# Patient Record
Sex: Female | Born: 1980 | Race: White | Hispanic: No | Marital: Single | Smoking: Current every day smoker
Health system: Southern US, Community
[De-identification: ages and names within clinical notes are randomized; demographics above are authoritative.]

## PROBLEM LIST (undated history)

## (undated) DIAGNOSIS — F909 Attention-deficit hyperactivity disorder, unspecified type: Secondary | ICD-10-CM

## (undated) HISTORY — PX: TONSILLECTOMY: SUR1361

---
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
## 1999-03-08 ENCOUNTER — Other Ambulatory Visit: Admission: RE | Admit: 1999-03-08 | Discharge: 1999-03-08 | Payer: Self-pay | Admitting: Obstetrics and Gynecology

## 1999-05-01 ENCOUNTER — Encounter (HOSPITAL_COMMUNITY): Admission: RE | Admit: 1999-05-01 | Discharge: 1999-05-08 | Payer: Self-pay | Admitting: Obstetrics and Gynecology

## 1999-05-05 ENCOUNTER — Encounter (INDEPENDENT_AMBULATORY_CARE_PROVIDER_SITE_OTHER): Payer: Self-pay | Admitting: Specialist

## 1999-05-05 ENCOUNTER — Inpatient Hospital Stay (HOSPITAL_COMMUNITY): Admission: AD | Admit: 1999-05-05 | Discharge: 1999-05-07 | Payer: Self-pay | Admitting: Obstetrics and Gynecology

## 1999-06-19 ENCOUNTER — Other Ambulatory Visit: Admission: RE | Admit: 1999-06-19 | Discharge: 1999-06-19 | Payer: Self-pay | Admitting: Obstetrics and Gynecology

## 1999-06-30 ENCOUNTER — Encounter (INDEPENDENT_AMBULATORY_CARE_PROVIDER_SITE_OTHER): Payer: Self-pay | Admitting: Specialist

## 1999-06-30 ENCOUNTER — Other Ambulatory Visit: Admission: RE | Admit: 1999-06-30 | Discharge: 1999-06-30 | Payer: Self-pay | Admitting: Obstetrics and Gynecology

## 2004-08-14 ENCOUNTER — Emergency Department (HOSPITAL_COMMUNITY): Admission: EM | Admit: 2004-08-14 | Discharge: 2004-08-14 | Payer: Self-pay | Admitting: *Deleted

## 2005-01-30 ENCOUNTER — Emergency Department (HOSPITAL_COMMUNITY): Admission: EM | Admit: 2005-01-30 | Discharge: 2005-01-30 | Payer: Self-pay | Admitting: *Deleted

## 2009-08-04 ENCOUNTER — Emergency Department (HOSPITAL_COMMUNITY): Admission: EM | Admit: 2009-08-04 | Discharge: 2009-08-04 | Payer: Self-pay | Admitting: Emergency Medicine

## 2011-02-09 LAB — D-DIMER, QUANTITATIVE: D-Dimer, Quant: <0.22 ug/mL-FEU (ref 0.00–0.48)

## 2011-02-19 ENCOUNTER — Emergency Department (HOSPITAL_COMMUNITY)
Admission: EM | Admit: 2011-02-19 | Discharge: 2011-02-19 | Disposition: A | Discharge status: Home / Self Care | Payer: BLUE CROSS/BLUE SHIELD | Attending: Emergency Medicine | Admitting: Emergency Medicine

## 2011-02-19 DIAGNOSIS — L989 Disorder of the skin and subcutaneous tissue, unspecified: Secondary | ICD-10-CM | POA: Insufficient documentation

## 2011-02-19 DIAGNOSIS — L03039 Cellulitis of unspecified toe: Secondary | ICD-10-CM | POA: Insufficient documentation

## 2011-02-19 DIAGNOSIS — L02619 Cutaneous abscess of unspecified foot: Secondary | ICD-10-CM | POA: Insufficient documentation

## 2011-02-21 ENCOUNTER — Emergency Department (HOSPITAL_COMMUNITY)
Admission: EM | Admit: 2011-02-21 | Discharge: 2011-02-21 | Disposition: A | Discharge status: Home / Self Care | Payer: BLUE CROSS/BLUE SHIELD | Attending: Emergency Medicine | Admitting: Emergency Medicine

## 2011-02-21 DIAGNOSIS — Z09 Encounter for follow-up examination after completed treatment for conditions other than malignant neoplasm: Secondary | ICD-10-CM | POA: Insufficient documentation

## 2011-02-21 DIAGNOSIS — L02619 Cutaneous abscess of unspecified foot: Secondary | ICD-10-CM | POA: Insufficient documentation

## 2011-02-21 LAB — WOUND CULTURE: Gram Stain: NONE SEEN

## 2011-03-27 ENCOUNTER — Emergency Department (HOSPITAL_COMMUNITY): Payer: BLUE CROSS/BLUE SHIELD

## 2011-03-27 ENCOUNTER — Emergency Department (HOSPITAL_COMMUNITY)
Admission: EM | Admit: 2011-03-27 | Discharge: 2011-03-27 | Disposition: A | Discharge status: Home / Self Care | Payer: BLUE CROSS/BLUE SHIELD | Attending: Emergency Medicine | Admitting: Emergency Medicine

## 2011-03-27 DIAGNOSIS — R197 Diarrhea, unspecified: Secondary | ICD-10-CM | POA: Insufficient documentation

## 2011-03-27 DIAGNOSIS — R509 Fever, unspecified: Secondary | ICD-10-CM | POA: Insufficient documentation

## 2011-03-27 DIAGNOSIS — B9789 Other viral agents as the cause of diseases classified elsewhere: Secondary | ICD-10-CM | POA: Insufficient documentation

## 2011-03-27 DIAGNOSIS — R112 Nausea with vomiting, unspecified: Secondary | ICD-10-CM | POA: Insufficient documentation

## 2011-03-27 DIAGNOSIS — R05 Cough: Secondary | ICD-10-CM | POA: Insufficient documentation

## 2011-03-27 DIAGNOSIS — E860 Dehydration: Secondary | ICD-10-CM | POA: Insufficient documentation

## 2011-03-27 DIAGNOSIS — R059 Cough, unspecified: Secondary | ICD-10-CM | POA: Insufficient documentation

## 2011-03-27 LAB — BASIC METABOLIC PANEL
BUN: 8 mg/dL (ref 6–23)
CO2: 24 mEq/L (ref 19–32)
Chloride: 98 mEq/L (ref 96–112)
GFR calc non Af Amer: >60 mL/min (ref >60)
Glucose, Bld: 99 mg/dL (ref 70–99)
Potassium: 3.8 mEq/L (ref 3.5–5.1)
Sodium: 134 mEq/L — ABNORMAL LOW (ref 135–145)

## 2011-03-27 LAB — DIFFERENTIAL
Basophils Absolute: 0 10*3/uL (ref 0.0–0.1)
Basophils Relative: 0 % (ref 0–1)
Lymphocytes Relative: 21 % (ref 12–46)
Monocytes Absolute: 0.7 10*3/uL (ref 0.1–1.0)
Neutro Abs: 2.6 10*3/uL (ref 1.7–7.7)
Neutrophils Relative %: 62 % (ref 43–77)

## 2011-03-27 LAB — POCT I-STAT, CHEM 8
BUN: 8 mg/dL (ref 6–23)
Chloride: 102 mEq/L (ref 96–112)
Creatinine, Ser: 1 mg/dL (ref 0.4–1.2)
Glucose, Bld: 100 mg/dL — ABNORMAL HIGH (ref 70–99)
Potassium: 3.9 mEq/L (ref 3.5–5.1)
Sodium: 136 mEq/L (ref 135–145)

## 2011-03-27 LAB — URINALYSIS, ROUTINE W REFLEX MICROSCOPIC
Glucose, UA: NEGATIVE mg/dL
Specific Gravity, Urine: 1.029 (ref 1.005–1.030)
Urobilinogen, UA: 1 mg/dL (ref 0.0–1.0)
pH: 6 (ref 5.0–8.0)

## 2011-03-27 LAB — CBC
HCT: 44.5 % (ref 36.0–46.0)
Hemoglobin: 15.2 g/dL — ABNORMAL HIGH (ref 12.0–15.0)
RBC: 4.79 MIL/uL (ref 3.87–5.11)

## 2011-03-27 LAB — URINE MICROSCOPIC-ADD ON

## 2011-03-28 LAB — URINE CULTURE: Culture: NO GROWTH

## 2012-06-20 ENCOUNTER — Emergency Department (HOSPITAL_COMMUNITY): Payer: BC Managed Care – PPO

## 2012-06-20 ENCOUNTER — Encounter (HOSPITAL_COMMUNITY): Payer: Self-pay | Admitting: Family Medicine

## 2012-06-20 ENCOUNTER — Emergency Department (HOSPITAL_COMMUNITY)
Admission: EM | Admit: 2012-06-20 | Discharge: 2012-06-20 | Disposition: A | Discharge status: Home / Self Care | Payer: BC Managed Care – PPO | Attending: Emergency Medicine | Admitting: Emergency Medicine

## 2012-06-20 DIAGNOSIS — E119 Type 2 diabetes mellitus without complications: Secondary | ICD-10-CM | POA: Insufficient documentation

## 2012-06-20 DIAGNOSIS — R079 Chest pain, unspecified: Secondary | ICD-10-CM | POA: Insufficient documentation

## 2012-06-20 DIAGNOSIS — M545 Low back pain, unspecified: Secondary | ICD-10-CM | POA: Insufficient documentation

## 2012-06-20 DIAGNOSIS — M542 Cervicalgia: Secondary | ICD-10-CM | POA: Insufficient documentation

## 2012-06-20 DIAGNOSIS — F17200 Nicotine dependence, unspecified, uncomplicated: Secondary | ICD-10-CM | POA: Insufficient documentation

## 2012-06-20 DIAGNOSIS — Y92410 Unspecified street and highway as the place of occurrence of the external cause: Secondary | ICD-10-CM | POA: Insufficient documentation

## 2012-06-20 MED ORDER — HYDROCODONE-ACETAMINOPHEN 5-325 MG PO TABS: 2.0000 | ORAL_TABLET | Freq: Once | ORAL | Status: DC

## 2012-06-20 MED ORDER — OXYCODONE-ACETAMINOPHEN 5-325 MG PO TABS
2.0000 | ORAL_TABLET | Freq: Once | ORAL | Status: AC
  Administered 2012-06-20: 2 via ORAL
  Filled 2012-06-20: qty 2

## 2012-06-20 MED ORDER — HYDROCODONE-ACETAMINOPHEN 5-325 MG PO TABS: 2.0000 | ORAL_TABLET | ORAL | Status: AC | PRN
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
## 2012-06-20 NOTE — ED Notes (Signed)
Pt not in room.
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
## 2013-09-09 ENCOUNTER — Encounter (HOSPITAL_COMMUNITY): Payer: Self-pay | Admitting: Emergency Medicine

## 2013-09-09 ENCOUNTER — Emergency Department (HOSPITAL_COMMUNITY): Payer: BC Managed Care – PPO

## 2013-09-09 ENCOUNTER — Emergency Department (HOSPITAL_COMMUNITY)
Admission: EM | Admit: 2013-09-09 | Discharge: 2013-09-09 | Disposition: A | Discharge status: Home / Self Care | Payer: BC Managed Care – PPO | Attending: Emergency Medicine | Admitting: Emergency Medicine

## 2013-09-09 DIAGNOSIS — R112 Nausea with vomiting, unspecified: Secondary | ICD-10-CM | POA: Insufficient documentation

## 2013-09-09 DIAGNOSIS — Z3202 Encounter for pregnancy test, result negative: Secondary | ICD-10-CM | POA: Insufficient documentation

## 2013-09-09 DIAGNOSIS — E119 Type 2 diabetes mellitus without complications: Secondary | ICD-10-CM | POA: Insufficient documentation

## 2013-09-09 DIAGNOSIS — F909 Attention-deficit hyperactivity disorder, unspecified type: Secondary | ICD-10-CM | POA: Insufficient documentation

## 2013-09-09 DIAGNOSIS — N12 Tubulo-interstitial nephritis, not specified as acute or chronic: Secondary | ICD-10-CM | POA: Insufficient documentation

## 2013-09-09 DIAGNOSIS — Z79899 Other long term (current) drug therapy: Secondary | ICD-10-CM | POA: Insufficient documentation

## 2013-09-09 DIAGNOSIS — F17200 Nicotine dependence, unspecified, uncomplicated: Secondary | ICD-10-CM | POA: Insufficient documentation

## 2013-09-09 HISTORY — DX: Attention-deficit hyperactivity disorder, unspecified type: F90.9

## 2013-09-09 LAB — URINALYSIS, ROUTINE W REFLEX MICROSCOPIC
Nitrite: POSITIVE — AB
Specific Gravity, Urine: 1.01 (ref 1.005–1.030)
Urobilinogen, UA: 0.2 mg/dL (ref 0.0–1.0)
pH: 7 (ref 5.0–8.0)

## 2013-09-09 LAB — POCT PREGNANCY, URINE: Preg Test, Ur: NEGATIVE

## 2013-09-09 LAB — URINE MICROSCOPIC-ADD ON

## 2013-09-09 MED ORDER — OXYCODONE-ACETAMINOPHEN 5-325 MG PO TABS
1.0000 | ORAL_TABLET | Freq: Once | ORAL | Status: AC
  Administered 2013-09-09: 1 via ORAL
  Filled 2013-09-09: qty 1

## 2013-09-09 MED ORDER — OXYCODONE-ACETAMINOPHEN 5-325 MG PO TABS: 1.0000 | ORAL_TABLET | Freq: Four times a day (QID) | ORAL | Status: DC | PRN

## 2013-09-09 MED ORDER — ONDANSETRON 4 MG PO TBDP: 4.0000 mg | ORAL_TABLET | Freq: Three times a day (TID) | ORAL | Status: DC | PRN

## 2013-09-09 MED ORDER — CIPROFLOXACIN HCL 500 MG PO TABS: 500.0000 mg | ORAL_TABLET | Freq: Two times a day (BID) | ORAL | Status: DC

## 2013-09-09 MED ORDER — MORPHINE SULFATE 4 MG/ML IJ SOLN
4.0000 mg | Freq: Once | INTRAMUSCULAR | Status: AC
  Administered 2013-09-09: 4 mg via INTRAVENOUS
  Filled 2013-09-09: qty 1

## 2013-09-09 NOTE — Progress Notes (Signed)
Patient confirms she does not have a pcp.  [facility] provided patient with a list of pcps who accept [employer] and [employer].  Patient thankful for information.  No further needs at this time.

## 2013-09-09 NOTE — ED Notes (Signed)
Pt reports right sided flank pain, nausea and vomiting since yesterday. Took percocet last night which temporarily helped relieve pain. Frequent urination, states urine is "dark and cloudy". No hx of kidney stones

## 2013-09-09 NOTE — ED Provider Notes (Signed)
CSN: [id number]     Arrival date & time 09/09/13  1554 History   First MD Initiated Contact with Patient 09/09/13 1647     Chief Complaint  Patient presents with  . Flank Pain   (Consider location/radiation/quality/duration/timing/severity/associated sxs/prior Treatment) HPI Comments: Patient is a 32 year old female past medical history significant for DM and ADHD presenting to the emergency department for 2 days of right-sided sharp pain w/ associated  1 episode of nonbloody nonbilious vomiting, nausea, urinary urgency and frequency. Pain was minimally alleviated by a Percocet she took at home. No aggravating factors. She rates her pain 9/10. Patient denies having history of this pain in the past. Denies fevers. No history of kidney stones. Currently on menstrual cycle. No abdominal surgical history.   Patient is a 32 y.o. female presenting with flank pain.  Flank Pain Associated symptoms include nausea and vomiting. Pertinent negatives include no abdominal pain, chills, fever or headaches.    Past Medical History  Diagnosis Date  . Diabetes mellitus     controlled with diet and exercise  . ADHD (attention deficit hyperactivity disorder)    Past Surgical History  Procedure Laterality Date  . Tonsillectomy     No family history on file. History  Substance Use Topics  . Smoking status: Current Every Day Smoker -- 1.00 packs/day  . Smokeless tobacco: Not on file  . Alcohol Use: Yes     Comment: socially   OB History   Grav Para Term Preterm Abortions TAB SAB Ect Mult Living                 Review of Systems  Constitutional: Negative for fever and chills.  Respiratory: Negative for shortness of breath.   Gastrointestinal: Positive for nausea and vomiting. Negative for abdominal pain.  Genitourinary: Positive for urgency, frequency and flank pain.  Musculoskeletal: Negative for back pain.  Skin: Negative.   Neurological: Negative for light-headedness and headaches.  All  other systems reviewed and are negative.    Allergies  Review of patient's allergies indicates no known allergies.  Home Medications   Current Outpatient Rx  Name  Route  Sig  Dispense  Refill  . amphetamine-dextroamphetamine (ADDERALL) 20 MG tablet   Oral   Take 20 mg by mouth 2 (two) times daily.         Marland Kitchen ibuprofen (ADVIL,MOTRIN) 200 MG tablet   Oral   Take 400 mg by mouth every 8 (eight) hours as needed. For pain.         . norgestimate-ethinyl estradiol (SPRINTEC 28) 0.25-35 MG-MCG tablet   Oral   Take 1 tablet by mouth daily.         . solifenacin (VESICARE) 5 MG tablet   Oral   Take 5 mg by mouth daily.         . ciprofloxacin (CIPRO) 500 MG tablet   Oral   Take 1 tablet (500 mg total) by mouth every 12 (twelve) hours.   20 tablet   0   . ondansetron (ZOFRAN ODT) 4 MG disintegrating tablet   Oral   Take 1 tablet (4 mg total) by mouth every 8 (eight) hours as needed for nausea or vomiting.   10 tablet   0   . oxyCODONE-acetaminophen (PERCOCET/ROXICET) 5-325 MG per tablet   Oral   Take 1-2 tablets by mouth every 6 (six) hours as needed for severe pain.   12 tablet   0    BP 130/78  Pulse 61  Temp(Src)  98.3 F (36.8 C) (Oral)  Resp 18  SpO2 100%  LMP 09/06/2013 Physical Exam  Constitutional: She is oriented to person, place, and time. She appears well-developed and well-nourished. No distress.  HENT:  Head: Normocephalic and atraumatic.  Right Ear: External ear normal.  Left Ear: External ear normal.  Nose: Nose normal.  Mouth/Throat: Oropharynx is clear and moist.  Eyes: Conjunctivae are normal. Pupils are equal, round, and reactive to light.  Neck: Normal range of motion. Neck supple.  Cardiovascular: Normal rate, regular rhythm, normal heart sounds and intact distal pulses.   Pulmonary/Chest: Effort normal and breath sounds normal. No respiratory distress. She exhibits no tenderness.  Abdominal: Soft. Bowel sounds are normal. She  exhibits no distension and no mass. There is no tenderness. There is CVA tenderness (R sided). There is no rigidity, no rebound and no guarding.  Musculoskeletal: Normal range of motion. She exhibits no edema.  Neurological: She is alert and oriented to person, place, and time.  Skin: Skin is warm and dry. She is not diaphoretic. No pallor.  Psychiatric: She has a normal mood and affect.    ED Course  Procedures (including critical care time) Medications  morphine 4 MG/ML injection 4 mg (4 mg Intravenous Given 09/09/13 1822)  oxyCODONE-acetaminophen (PERCOCET/ROXICET) 5-325 MG per tablet 1 tablet (1 tablet Oral Given 09/09/13 2054)    Labs Review Labs Reviewed  URINALYSIS, ROUTINE W REFLEX MICROSCOPIC - Abnormal; Notable for the following:    APPearance CLOUDY (*)    Hgb urine dipstick SMALL (*)    Nitrite POSITIVE (*)    Leukocytes, UA SMALL (*)    All other components within normal limits  URINE MICROSCOPIC-ADD ON - Abnormal; Notable for the following:    Bacteria, UA MANY (*)    All other components within normal limits  URINE CULTURE  POCT PREGNANCY, URINE   Imaging Review Ct Abdomen Pelvis Wo Contrast  09/09/2013   CLINICAL DATA:  Right-sided flank pain, nausea, and vomiting for 1 day  EXAM: CT ABDOMEN AND PELVIS WITHOUT CONTRAST  TECHNIQUE: Multidetector CT imaging of the abdomen and pelvis was performed following the standard protocol without intravenous contrast.  COMPARISON:  None.  FINDINGS: Visualized portions of the lung bases are clear. Liver and gallbladder are normal. Spleen and pancreas are normal. Adrenal glands are normal. Kidneys are normal. Bladder is normal. Reproductive organs are normal. There is no free fluid. The appendix is not clearly seen, but there is no inflammatory change in the region of the appendix. There appears to be a tampon present. There are no acute musculoskeletal findings. Mild but early atherosclerotic change involving the distal aorta and  proximal right iliac artery.  IMPRESSION: No acute abnormalities. Mild atherosclerotic calcification of the aortoiliac vessels, notable for patient age.   Electronically Signed   By: Esperanza Heir M.D.   On: 09/09/2013 19:31    EKG Interpretation   None       MDM   1. Pyelonephritis     Afebrile, NAD, non-toxic appearing, AAOx4. No episodes of emesis in ED. Pt w/ R sided CVA tenderness. Urine with nitrates and leukocytosis. Abdomen soft, non-tender, non-distended w/o guarding or rigidity or rebound. I have reviewed nursing notes, vital signs, and all appropriate lab and imaging results for this patient. No clinical indication for admission at this time. Notified patient of CT scans, including atherosclerotic calcifications. Will discharge pt home on antibiotics. Advised PCP f/u for infection and further evaluation and management of atherosclerosis. Return precautions  discussed. Patient is agreeable to plan. Patient is stable at time of discharge. Patient d/w with Dr. Blinda Leatherwood, agrees with plan.          Lise Auer Olivea Sonnen, PA-C 09/10/13 0006

## 2013-09-09 NOTE — ED Notes (Signed)
Pt up to restroom in attempt to provide a urine sample.

## 2013-09-11 LAB — URINE CULTURE

## 2013-09-12 NOTE — ED Provider Notes (Signed)
Medical screening examination/treatment/procedure(s) were performed by non-physician practitioner and as supervising physician I was immediately available for consultation/collaboration.  Gilda Crease, MD 09/12/13 [phone]

## 2014-07-07 ENCOUNTER — Emergency Department (HOSPITAL_COMMUNITY)
Admission: EM | Admit: 2014-07-07 | Discharge: 2014-07-07 | Disposition: A | Discharge status: Home / Self Care | Payer: BC Managed Care – PPO | Attending: Emergency Medicine | Admitting: Emergency Medicine

## 2014-07-07 ENCOUNTER — Encounter (HOSPITAL_COMMUNITY): Payer: Self-pay | Admitting: Emergency Medicine

## 2014-07-07 DIAGNOSIS — R221 Localized swelling, mass and lump, neck: Secondary | ICD-10-CM

## 2014-07-07 DIAGNOSIS — E119 Type 2 diabetes mellitus without complications: Secondary | ICD-10-CM | POA: Diagnosis not present

## 2014-07-07 DIAGNOSIS — R220 Localized swelling, mass and lump, head: Secondary | ICD-10-CM | POA: Diagnosis present

## 2014-07-07 DIAGNOSIS — Z79899 Other long term (current) drug therapy: Secondary | ICD-10-CM | POA: Insufficient documentation

## 2014-07-07 DIAGNOSIS — L0201 Cutaneous abscess of face: Secondary | ICD-10-CM | POA: Diagnosis not present

## 2014-07-07 DIAGNOSIS — F17200 Nicotine dependence, unspecified, uncomplicated: Secondary | ICD-10-CM | POA: Insufficient documentation

## 2014-07-07 DIAGNOSIS — F909 Attention-deficit hyperactivity disorder, unspecified type: Secondary | ICD-10-CM | POA: Insufficient documentation

## 2014-07-07 DIAGNOSIS — L03211 Cellulitis of face: Secondary | ICD-10-CM

## 2014-07-07 LAB — BASIC METABOLIC PANEL
Anion gap: 13 (ref 5–15)
BUN: 7 mg/dL (ref 6–23)
CALCIUM: 9 mg/dL (ref 8.4–10.5)
CO2: 25 meq/L (ref 19–32)
Chloride: 101 mEq/L (ref 96–112)
Creatinine, Ser: 0.79 mg/dL (ref 0.50–1.10)
GFR calc Af Amer: >90 mL/min (ref >90)
GFR calc non Af Amer: >90 mL/min (ref >90)
GLUCOSE: 90 mg/dL (ref 70–99)
Potassium: 3.7 mEq/L (ref 3.7–5.3)
SODIUM: 139 meq/L (ref 137–147)

## 2014-07-07 LAB — CBC WITH DIFFERENTIAL/PLATELET
Basophils Absolute: 0 10*3/uL (ref 0.0–0.1)
Basophils Relative: 0 % (ref 0–1)
EOS ABS: 0.1 10*3/uL (ref 0.0–0.7)
Eosinophils Relative: 1 % (ref 0–5)
HCT: 40.6 % (ref 36.0–46.0)
HEMOGLOBIN: 14 g/dL (ref 12.0–15.0)
LYMPHS ABS: 3.4 10*3/uL (ref 0.7–4.0)
LYMPHS PCT: 26 % (ref 12–46)
MCH: 32.8 pg (ref 26.0–34.0)
MCHC: 34.5 g/dL (ref 30.0–36.0)
MCV: 95.1 fL (ref 78.0–100.0)
MONOS PCT: 7 % (ref 3–12)
Monocytes Absolute: 1 10*3/uL (ref 0.1–1.0)
Neutro Abs: 8.6 10*3/uL — ABNORMAL HIGH (ref 1.7–7.7)
Neutrophils Relative %: 66 % (ref 43–77)
PLATELETS: 331 10*3/uL (ref 150–400)
RBC: 4.27 MIL/uL (ref 3.87–5.11)
RDW: 13.6 % (ref 11.5–15.5)
WBC: 13.1 10*3/uL — AB (ref 4.0–10.5)

## 2014-07-07 MED ORDER — OXYCODONE-ACETAMINOPHEN 5-325 MG PO TABS
2.0000 | ORAL_TABLET | Freq: Once | ORAL | Status: AC
  Administered 2014-07-07: 2 via ORAL
  Filled 2014-07-07: qty 2

## 2014-07-07 MED ORDER — CLINDAMYCIN PHOSPHATE 600 MG/50ML IV SOLN
600.0000 mg | Freq: Once | INTRAVENOUS | Status: AC
  Administered 2014-07-07: 600 mg via INTRAVENOUS
  Filled 2014-07-07: qty 50

## 2014-07-07 MED ORDER — OXYCODONE-ACETAMINOPHEN 5-325 MG PO TABS: 1.0000 | ORAL_TABLET | Freq: Four times a day (QID) | ORAL | Status: AC | PRN

## 2014-07-07 MED ORDER — CLINDAMYCIN HCL 150 MG PO CAPS: 300.0000 mg | ORAL_CAPSULE | Freq: Three times a day (TID) | ORAL | Status: AC

## 2014-07-07 NOTE — ED Notes (Signed)
Pt's boyfriend has active staph infection, pt popped pimple on right cheek on Saturday, had normal scab until yesterday when some local redness and swelling started, today redness and swelling has moved down to lower jaw, upper lateral neck, and right lateral orbital area. Pt c/o strong pain to area, worries for staph infection.

## 2014-07-07 NOTE — Discharge Instructions (Signed)

## 2014-07-07 NOTE — ED Provider Notes (Signed)
CSN: [id number]     Arrival date & time 07/07/14  1733 History   First MD Initiated Contact with Patient 07/07/14 2024     Chief Complaint  Patient presents with  . Facial Swelling     (Consider location/radiation/quality/duration/timing/severity/associated sxs/prior Treatment) HPI Comments: Patient is a 33 year old female with history of well controlled diabetes and ADHD who presents to the ED with one day of worsening redness to her face. She reports it started out as a pimple which she popped on Saturday. She has severe pain which is radiating to her eye and down into her jaw. No fevers, chills, nausea, vomiting. She has taken ibuprofen with little relief of her symptoms. Last dose was at 1pm.   The history is provided by the patient. No language interpreter was used.    Past Medical History  Diagnosis Date  . Diabetes mellitus     controlled with diet and exercise  . ADHD (attention deficit hyperactivity disorder)    Past Surgical History  Procedure Laterality Date  . Tonsillectomy     History reviewed. No pertinent family history. History  Substance Use Topics  . Smoking status: Current Every Day Smoker -- 1.00 packs/day  . Smokeless tobacco: Not on file  . Alcohol Use: Yes     Comment: socially   OB History   Grav Para Term Preterm Abortions TAB SAB Ect Mult Living                 Review of Systems  Constitutional: Negative for fever and chills.  Respiratory: Negative for shortness of breath.   Cardiovascular: Negative for chest pain.  Gastrointestinal: Negative for nausea, vomiting and abdominal pain.  Skin: Positive for color change.  All other systems reviewed and are negative.     Allergies  Review of patient's allergies indicates no known allergies.  Home Medications   Prior to Admission medications   Medication Sig Start Date End Date Taking? Authorizing Provider  amphetamine-dextroamphetamine (ADDERALL) 20 MG tablet Take 20 mg by mouth 2 (two) times  daily.   Yes Historical Provider, MD  ibuprofen (ADVIL,MOTRIN) 200 MG tablet Take 800 mg by mouth every 8 (eight) hours as needed. For pain.   Yes Historical Provider, MD  norgestimate-ethinyl estradiol (SPRINTEC 28) 0.25-35 MG-MCG tablet Take 1 tablet by mouth daily.   Yes Historical Provider, MD   BP 139/71  Pulse 77  Temp(Src) 98.1 F (36.7 C) (Oral)  Resp 20  SpO2 100% Physical Exam  Nursing note and vitals reviewed. Constitutional: She is oriented to person, place, and time. She appears well-developed and well-nourished.  Non-toxic appearance. She does not have a sickly appearance. She does not appear ill. No distress.  Well appearing  HENT:  Head: Normocephalic and atraumatic.  Right Ear: External ear normal.  Left Ear: External ear normal.  Nose: Nose normal.  Mouth/Throat: Oropharynx is clear and moist.  No trismus, submental edema, or tongue elevation 2 cm area of erythema to right cheek. No fluctuance or induration. Tender to palpation.   Eyes: Conjunctivae are normal.  Neck: Normal range of motion.  Cardiovascular: Normal rate, regular rhythm and normal heart sounds.   Pulmonary/Chest: Effort normal and breath sounds normal. No stridor. No respiratory distress. She has no wheezes. She has no rales.  Abdominal: Soft. She exhibits no distension.  Musculoskeletal: Normal range of motion.  Neurological: She is alert and oriented to person, place, and time. She has normal strength.  Skin: Skin is warm and dry.  She is not diaphoretic. No erythema.  Psychiatric: She has a normal mood and affect. Her behavior is normal.    ED Course  Procedures (including critical care time) Labs Review Labs Reviewed  CBC WITH DIFFERENTIAL - Abnormal; Notable for the following:    WBC 13.1 (*)    Neutro Abs 8.6 (*)    All other components within normal limits  BASIC METABOLIC PANEL    Imaging Review No results found.   EKG Interpretation None      MDM   Final diagnoses:   Cellulitis of face    Patient presents to the emergency department for evaluation of cellulitis to her right cheek. She is nontoxic, nonseptic in appearance. No trismus, submental edema, or tongue elevation. No concern for deep space infection at this point. She received IV clindamycin in the emergency department and reported that she could already feel her face getting better. Patient will be sent home with rx for clindamycin. Discussed reasons to return to ED immediately. Vital signs stable for discharge. Patient / Family / Caregiver informed of clinical course, understand medical decision-making process, and agree with plan.   Mora Bellman, PA-C 07/08/14 0045

## 2014-07-11 NOTE — ED Provider Notes (Signed)
Medical screening examination/treatment/procedure(s) were performed by non-physician practitioner and as supervising physician I was immediately available for consultation/collaboration.   EKG Interpretation None        Rolan Bucco, MD 07/11/14 [phone]

## 2015-04-21 IMAGING — CT CT ABD-PELV W/O CM
1 series · 14 of 17 positions shown, 20 images · non-contrast
Comparison: None.

CLINICAL DATA: Right-sided flank pain, nausea, and vomiting for 1
day

EXAM:
CT ABDOMEN AND PELVIS WITHOUT CONTRAST
TECHNIQUE: Multidetector CT imaging of the abdomen and pelvis was performed
following the standard protocol without intravenous contrast.

[Series 6: lung · axial · 0.74mm/px · z∈[+1358,+1428]mm · 14 of 17 slices shown, 20 images]
[im 2/17  soft-tissue]
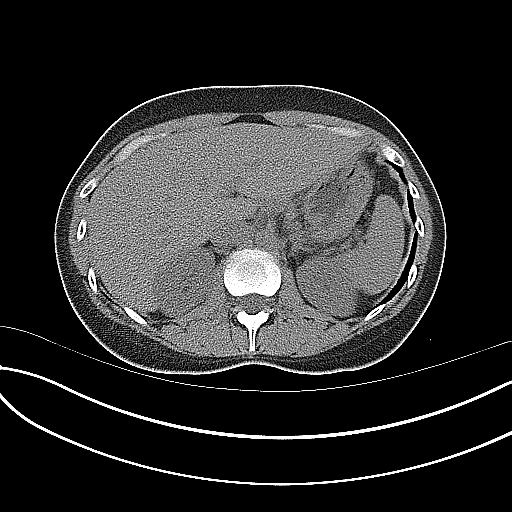
[im 2/17  bone]
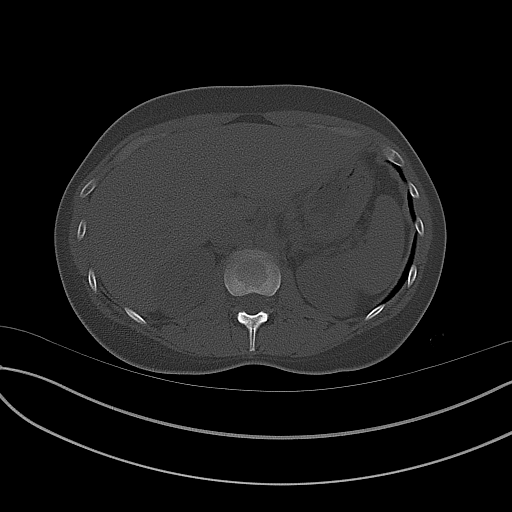
[im 3/17  soft-tissue]
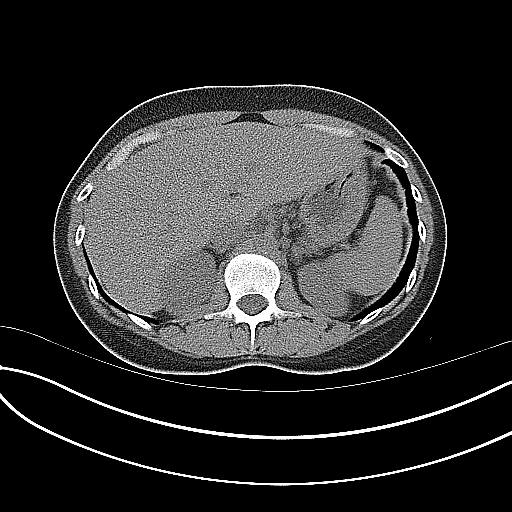
[im 4/17  soft-tissue]
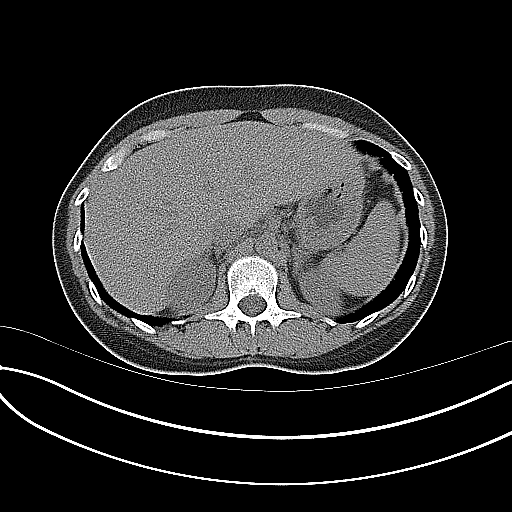
[im 5/17  soft-tissue]
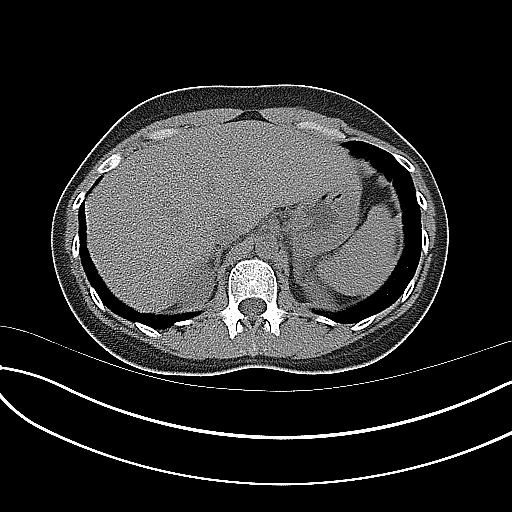
[im 6/17  soft-tissue]
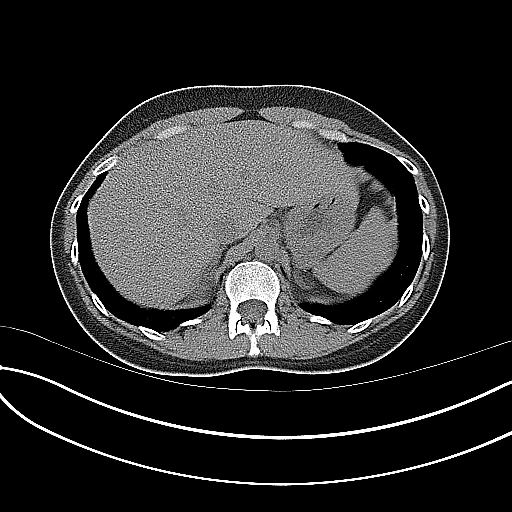
[im 7/17  soft-tissue]
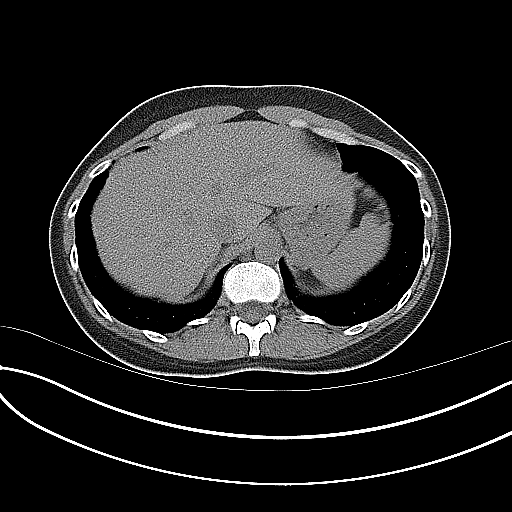
[im 8/17  soft-tissue]
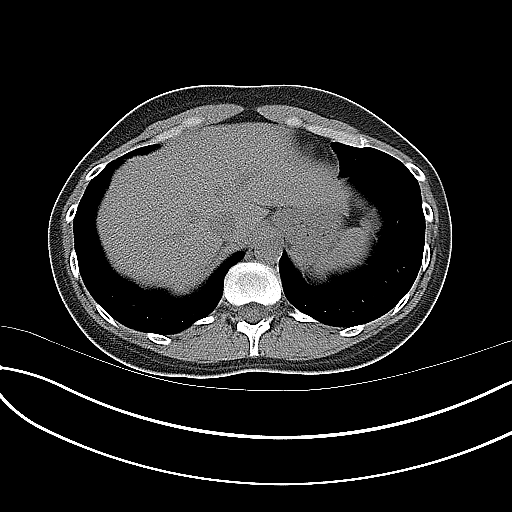
[im 10/17  soft-tissue]
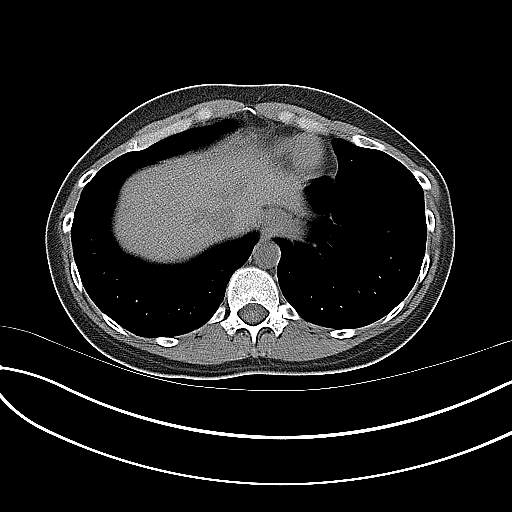
[im 11/17  soft-tissue]
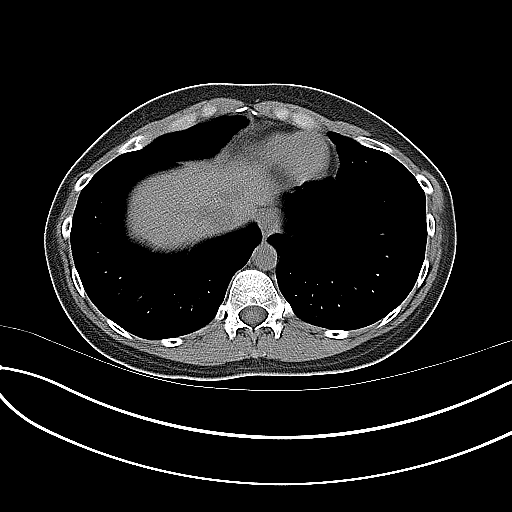
[im 11/17  bone]
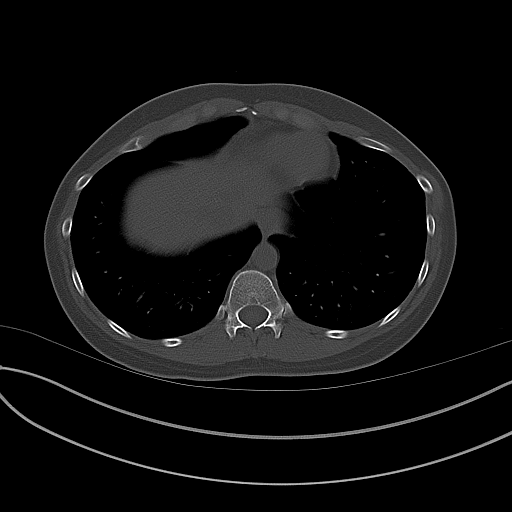
[im 12/17  soft-tissue]
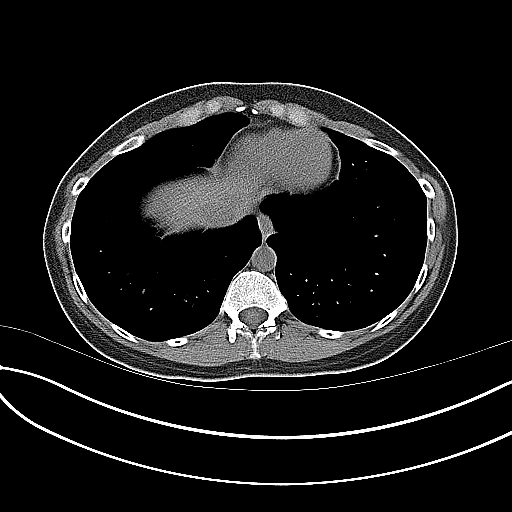
[im 13/17  soft-tissue]
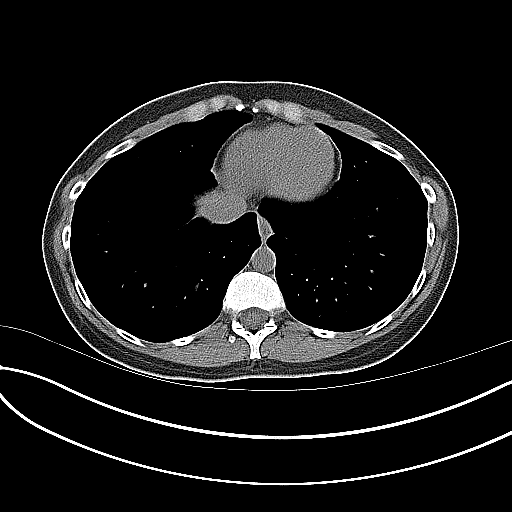
[im 13/17  lung]
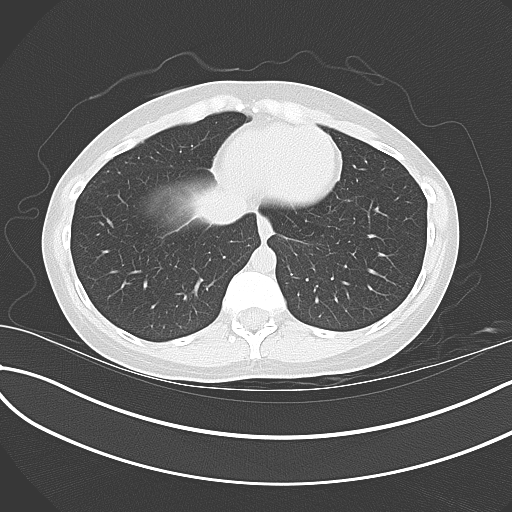
[im 14/17  soft-tissue]
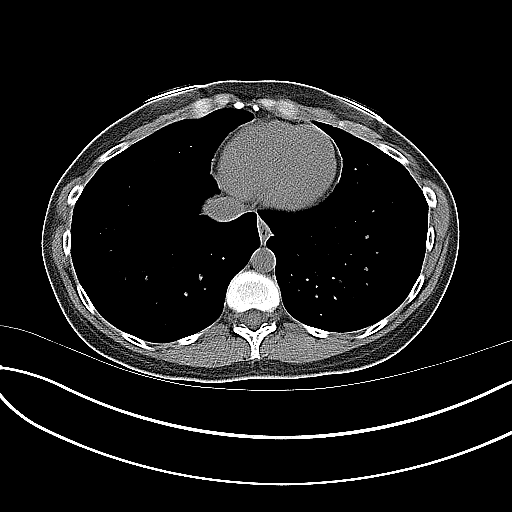
[im 14/17  lung]
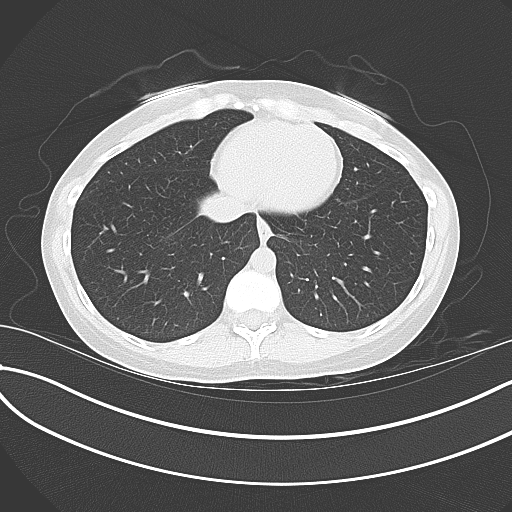
[im 15/17  soft-tissue]
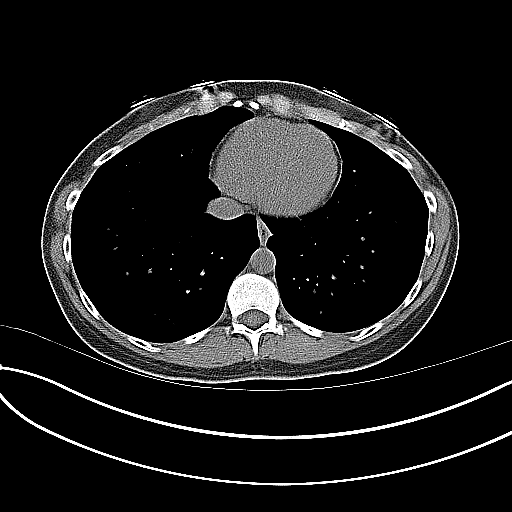
[im 15/17  lung]
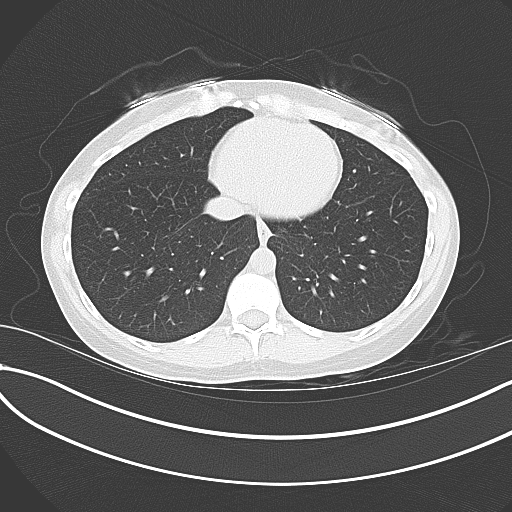
[im 16/17  soft-tissue]
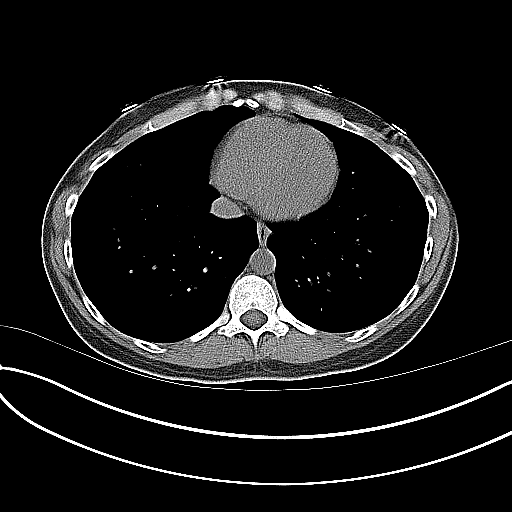
[im 16/17  lung]
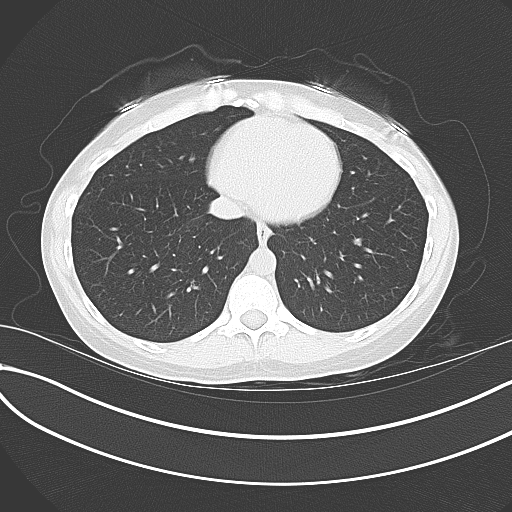

[14 of 17 positions shown; findings below may reference images not displayed]

FINDINGS: Visualized portions of the lung bases are clear. Liver and
gallbladder are normal. Spleen and pancreas are normal. Adrenal
glands are normal. Kidneys are normal. Bladder is normal.
Reproductive organs are normal. There is no free fluid. The appendix
is not clearly seen, but there is no inflammatory change in the
region of the appendix. There appears to be a tampon present. There
are no acute musculoskeletal findings. Mild but early
atherosclerotic change involving the distal aorta and proximal right
iliac artery.
IMPRESSION: No acute abnormalities. Mild atherosclerotic calcification of the
aortoiliac vessels, notable for patient age.
